# Patient Record
Sex: Female | Born: 1995
Health system: Southern US, Community
[De-identification: ages and names within clinical notes are randomized; demographics above are authoritative.]

## PROBLEM LIST (undated history)

## (undated) DIAGNOSIS — L219 Seborrheic dermatitis, unspecified: Secondary | ICD-10-CM

## (undated) DIAGNOSIS — N390 Urinary tract infection, site not specified: Secondary | ICD-10-CM

## (undated) DIAGNOSIS — G43909 Migraine, unspecified, not intractable, without status migrainosus: Secondary | ICD-10-CM

## (undated) HISTORY — DX: Seborrheic dermatitis, unspecified: L21.9

## (undated) HISTORY — DX: Migraine, unspecified, not intractable, without status migrainosus: G43.909

## (undated) HISTORY — DX: Urinary tract infection, site not specified: N39.0

---
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
## 2017-04-07 ENCOUNTER — Other Ambulatory Visit: Payer: Self-pay

## 2017-04-21 ENCOUNTER — Encounter: Payer: Self-pay | Admitting: Urology

## 2017-04-21 ENCOUNTER — Ambulatory Visit: Payer: BLUE CROSS/BLUE SHIELD | Admitting: Urology

## 2017-04-21 VITALS — BP 115/78 | HR 81 | Resp 16 | Wt 118.0 lb

## 2017-04-21 DIAGNOSIS — N390 Urinary tract infection, site not specified: Secondary | ICD-10-CM | POA: Diagnosis not present

## 2017-04-21 LAB — BLADDER SCAN AMB NON-IMAGING: SCAN RESULT: 21

## 2017-04-21 MED ORDER — CEPHALEXIN 250 MG PO CAPS: ORAL_CAPSULE | ORAL | 0 refills | Status: DC

## 2017-04-21 NOTE — Progress Notes (Signed)
04/21/2017 4:49 PM   [place] 03/25/1995 [id number]  Referring provider: Karen Kitchens, MD [address] [place], [place] [address]  Chief Complaint  Patient presents with  . Urinary Tract Infection    HPI: Patient is a 22 -year-old Caucasian female who is referred to [place] by, Dr. Corinda Gubler, for recurrent urinary tract infections.  Patient states that she has had 2 to 3 urinary tract infections over the last year.  Reviewing her records,  she has had three positive urine cultures for pan sensitive E. Coli.   Her symptoms with a urinary tract infection consist of frequency, dysuria and nocturia.  She did have a fever of 102 with chills associated with her last UTI.     She does not have a history of nephrolithiasis, GU surgery or GU trauma.  She is sexually active.  She has noted a correlation with her urinary tract infections and sexual intercourse.   She does not engage in anal sex.   She is voiding before and after sex.     She is not postmenopausal.   She denies constipation and/or diarrhea.  She does use tampons.  She does engage in good perineal hygiene. She does not take tub baths.     She does not have incontinence.   She is not having pain with bladder filling.    She has not had a RUS in 2016 which was normal.    She is drinking "quite a bit" of water daily.  She does not drink sodas or sugary drinks.     Her UA is negative.  Her PVR is 21 mL.     PMH: Past Medical History:  Diagnosis Date  . Migraine     Surgical History: History reviewed. No pertinent surgical history.  Home Medications:  Allergies as of 04/21/2017      Reactions   Sulfa Antibiotics Rash   Sulfate Rash      Medication List        Accurate as of 04/21/17 11:59 PM. Always use your most recent med list.          cephALEXin 250 MG capsule Commonly known as:  KEFLEX Take one capsule daily   SPRINTEC 28 0.25-35 MG-MCG tablet Generic drug:  norgestimate-ethinyl  estradiol TAKE 1 TABLET BY MOUTH EVERY DAY CONTINUOUSLY       Allergies:  Allergies  Allergen Reactions  . Sulfa Antibiotics Rash  . Sulfate Rash    Family History: Family History  Problem Relation Age of Onset  . Lung cancer Maternal Grandfather   . AVM Maternal Aunt     Social History:  reports that  has never smoked. she has never used smokeless tobacco. She reports that she uses drugs. Drug: Marijuana. She reports that she does not drink alcohol.  ROS: UROLOGY Frequent Urination?: Yes Hard to postpone urination?: No Burning/pain with urination?: Yes Get up at night to urinate?: Yes Leakage of urine?: No Urine stream starts and stops?: No Trouble starting stream?: No Do you have to strain to urinate?: No Blood in urine?: No Urinary tract infection?: Yes Sexually transmitted disease?: No Injury to kidneys or bladder?: No Painful intercourse?: No Weak stream?: No Currently pregnant?: No Vaginal bleeding?: No Last menstrual period?: 03/21/17  Gastrointestinal Nausea?: No Vomiting?: No Indigestion/heartburn?: No Diarrhea?: No Constipation?: No  Constitutional Fever: Yes Night sweats?: No Weight loss?: No Fatigue?: No  Skin Skin rash/lesions?: No Itching?: No  Eyes Blurred vision?: No Double vision?: No  Ears/Nose/Throat  Sore throat?: No Sinus problems?: No  Hematologic/Lymphatic Swollen glands?: No Easy bruising?: No  Cardiovascular Leg swelling?: No Chest pain?: No  Respiratory Cough?: No Shortness of breath?: No  Endocrine Excessive thirst?: No  Musculoskeletal Back pain?: Yes Joint pain?: No  Neurological Headaches?: No Dizziness?: No  Psychologic Depression?: No Anxiety?: No  Physical Exam: BP 115/78   Pulse 81   Resp 16   Wt 118 lb (53.5 kg)   SpO2 97%   Constitutional: Well nourished. Alert and oriented, No acute distress. HEENT: [place] AT, moist mucus membranes. Trachea midline, no masses. Cardiovascular: No  clubbing, cyanosis, or edema. Respiratory: Normal respiratory effort, no increased work of breathing. GI: Abdomen is soft, non tender, non distended, no abdominal masses. Liver and spleen not palpable.  No hernias appreciated.  Stool sample for occult testing is not indicated.   GU: No CVA tenderness.  No bladder fullness or masses.  Normal external genitalia, normal pubic hair distribution, no lesions.  Normal urethral meatus, no lesions, no prolapse, no discharge.   No urethral masses, tenderness and/or tenderness. No bladder fullness, tenderness or masses. Normal vagina mucosa, good estrogen effect, no discharge, no lesions, good pelvic support, no cystocele or rectocele noted.  No cervical motion tenderness.  Uterus is freely mobile and non-fixed.  No adnexal/parametria masses or tenderness noted.  Anus and perineum are without rashes or lesions.    Skin: No rashes, bruises or suspicious lesions. Lymph: No cervical or inguinal adenopathy. Neurologic: Grossly intact, no focal deficits, moving all 4 extremities. Psychiatric: Normal mood and affect.  Laboratory Data: No results found for: WBC, HGB, HCT, MCV, PLT  No results found for: CREATININE  No results found for: PSA  No results found for: TESTOSTERONE  No results found for: HGBA1C  No results found for: TSH  No results found for: CHOL, HDL, CHOLHDL, VLDL, LDLCALC  No results found for: AST No results found for: ALT No components found for: ALKALINEPHOPHATASE No components found for: BILIRUBINTOTAL  No results found for: ESTRADIOL  Urinalysis Negative.  See Epic.  I have reviewed the labs.   Pertinent Imaging: Results for SETAREH, ROM ROSE (MRN [id number]) as of 04/22/2017 16:37  Ref. Range 04/21/2017 16:18  Scan Result Unknown 21     Assessment & Plan:    1. Recurrent UTI's  - criteria for recurrent UTI has been met with 2 or more infections in 6 months or 3 or greater infections in one year   - Patient is  instructed to increase their water intake until the urine is pale yellow or clear (10 to 12 cups daily)   - Patient is encouraged to take probiotics (yogurt, oral pills or vaginal suppositories), take cranberry pills or drink the juice and Vitamin C 1,000 mg daily to acidify the urine daily   - if using tampons, she should remove them prior to urinating and change them often   - avoid soaking in tubs and wipe front to back after urinating   - advised them to have CATH UA's for urinalysis and culture to prevent skin contamination of the specimen  - reviewed symptoms of UTI and advised not to have urine checked or be treated for UTI if not experiencing symptoms  - discussed antibiotic stewardship with the patient                                      - Urinalysis,  Complete  - Bladder Scan (Post Void Residual) in office  - as patient is having sex on an almost daily basis - will prescribe Keflex 250 mg daily as a post-coital antibiotic for one month  - RTC in month for symptom recheck or sooner if she should experience breakthrough infections on the antibiotic  - schedule RUS to evaluate for a possible nidus for the infections   Return in about 1 month (around 05/19/2017) for recheck .  These notes generated with voice recognition software. I apologize for typographical errors.  Zara Council, [facility] [address] [place], [place] [address] [phone]

## 2017-04-21 NOTE — Patient Instructions (Signed)

## 2017-04-22 LAB — URINALYSIS, COMPLETE
Bilirubin, UA: NEGATIVE
GLUCOSE, UA: NEGATIVE
Ketones, UA: NEGATIVE
Nitrite, UA: NEGATIVE
Specific Gravity, UA: 1.02 (ref 1.005–1.030)
Urobilinogen, Ur: 0.2 mg/dL (ref 0.2–1.0)
pH, UA: 7 (ref 5.0–7.5)

## 2017-04-22 LAB — MICROSCOPIC EXAMINATION

## 2017-05-13 DIAGNOSIS — B373 Candidiasis of vulva and vagina: Secondary | ICD-10-CM | POA: Diagnosis not present

## 2017-05-17 ENCOUNTER — Ambulatory Visit
Admission: RE | Admit: 2017-05-17 | Discharge: 2017-05-17 | Disposition: A | Discharge status: Home / Self Care | Payer: BLUE CROSS/BLUE SHIELD | Source: Ambulatory Visit | Attending: Urology | Admitting: Urology

## 2017-05-17 DIAGNOSIS — N390 Urinary tract infection, site not specified: Secondary | ICD-10-CM | POA: Diagnosis not present

## 2017-05-19 ENCOUNTER — Ambulatory Visit: Payer: BLUE CROSS/BLUE SHIELD | Admitting: Urology

## 2017-05-25 NOTE — Progress Notes (Signed)
05/26/2017 3:05 PM   Denise PolesHanna Rose Butler [birth year] [id number]  Referring provider: Gilles Chiquitoabinowitz, Joseph H, MD [address] [place], [place] [id number]  No chief complaint on file.   HPI: Patient is a 22 year old Caucasian female with a history of recurrent urinary tract infections who was placed on post coital Keflex and presents today for a one-month follow-up.  Background history Patient is a 22 -year-old Caucasian female who is referred to us by, Dr. Ellin Goodieabinowitz, for recurrent urinary tract infections.  Patient states that she has had 2 to 3 urinary tract infections over the last year.  Reviewing her records,  she has had three positive urine cultures for pan sensitive E. Coli.   Her symptoms with a urinary tract infection consist of frequency, dysuria and nocturia.  She did have a fever of 102 with chills associated with her last UTI.   She does not have a history of nephrolithiasis, GU surgery or GU trauma.  She is sexually active.  She has noted a correlation with her urinary tract infections and sexual intercourse.   She does not engage in anal sex.   She is voiding before and after sex.   She is not postmenopausal.   She denies constipation and/or diarrhea.  She does use tampons.  She does engage in good perineal hygiene. She does not take tub baths.   She does not have incontinence.   She is not having pain with bladder filling.  She has not had a RUS in 2016 which was normal.  She is drinking "quite a bit" of water daily.  She does not drink sodas or sugary drinks.   Her UA is negative.  Her PVR is 21 mL.    RUS on 05/17/2017 noted mild fullness right renal pelvis without calyceal dilation.  Otherwise negative exam.  Today, she has not had any symptoms of an UTI.  She has not had any breakthrough infections.  Patient denies any gross hematuria, dysuria or suprapubic/flank pain.  Patient denies any fevers, chills, nausea or vomiting.      PMH: Past Medical History:  Diagnosis Date  .  Migraine     Surgical History: History reviewed. No pertinent surgical history.  Home Medications:  Allergies as of 05/26/2017      Reactions   Sulfa Antibiotics Rash   Sulfate Rash      Medication List        Accurate as of 05/26/17 11:59 PM. Always use your most recent med list.          cephALEXin 250 MG capsule Commonly known as:  KEFLEX Take one capsule daily   SPRINTEC 28 0.25-35 MG-MCG tablet Generic drug:  norgestimate-ethinyl estradiol TAKE 1 TABLET BY MOUTH EVERY DAY CONTINUOUSLY       Allergies:  Allergies  Allergen Reactions  . Sulfa Antibiotics Rash  . Sulfate Rash    Family History: Family History  Problem Relation Age of Onset  . Lung cancer Maternal Grandfather   . AVM Maternal Aunt     Social History:  reports that she has never smoked. She has never used smokeless tobacco. She reports that she has current or past drug history. Drug: Marijuana. She reports that she does not drink alcohol.  ROS: UROLOGY Frequent Urination?: No Hard to postpone urination?: No Burning/pain with urination?: No Get up at night to urinate?: No Leakage of urine?: No Urine stream starts and stops?: No Trouble starting stream?: No Do you have to strain to urinate?: No Blood  in urine?: No Urinary tract infection?: No Sexually transmitted disease?: No Injury to kidneys or bladder?: No Painful intercourse?: No Weak stream?: No Currently pregnant?: No Vaginal bleeding?: No Last menstrual period?: 05/10/17  Gastrointestinal Nausea?: No Vomiting?: No Indigestion/heartburn?: No Diarrhea?: No Constipation?: No  Constitutional Fever: No Night sweats?: No Weight loss?: No Fatigue?: No  Skin Skin rash/lesions?: No Itching?: No  Eyes Blurred vision?: No Double vision?: No  Ears/Nose/Throat Sore throat?: No Sinus problems?: No  Hematologic/Lymphatic Swollen glands?: No Easy bruising?: No  Cardiovascular Leg swelling?: No Chest pain?:  No  Respiratory Cough?: No Shortness of breath?: No  Endocrine Excessive thirst?: No  Musculoskeletal Back pain?: No Joint pain?: No  Neurological Headaches?: No Dizziness?: No  Psychologic Depression?: No Anxiety?: No  Physical Exam: BP 104/68 (BP Location: Right Arm, Patient Position: Sitting, Cuff Size: Normal)   Pulse 78   Ht 5\' 6"  (1.676 m)   Wt 120 lb 11.2 oz (54.7 kg)   BMI 19.48 kg/m   Constitutional: Well nourished. Alert and oriented, No acute distress. HEENT: [place] AT, moist mucus membranes. Trachea midline, no masses. Cardiovascular: No clubbing, cyanosis, or edema. Respiratory: Normal respiratory effort, no increased work of breathing. Skin: No rashes, bruises or suspicious lesions. Neurologic: Grossly intact, no focal deficits, moving all 4 extremities. Psychiatric: Normal mood and affect.  Laboratory Data: No results found for: WBC, HGB, HCT, MCV, PLT  No results found for: CREATININE  No results found for: PSA  No results found for: TESTOSTERONE  No results found for: HGBA1C  No results found for: TSH  No results found for: CHOL, HDL, CHOLHDL, VLDL, LDLCALC  No results found for: AST No results found for: ALT No components found for: ALKALINEPHOPHATASE No components found for: BILIRUBINTOTAL  No results found for: ESTRADIOL  I have reviewed the labs.   Pertinent Imaging: CLINICAL DATA:  22 year old female with recurrent UTIs. Initial encounter.  EXAM: RENAL / URINARY TRACT ULTRASOUND COMPLETE  COMPARISON:  None.  FINDINGS: Right Kidney:  Length: 11.3 cm. Echogenicity within normal limits. No mass visualized. Mild fullness right renal pelvis without calyceal dilation.  Left Kidney:  Length: 11.2 cm. Echogenicity within normal limits. No mass or hydronephrosis visualized.  Bladder:  Appears normal for degree of bladder distention. Bilateral ureteral jets noted.  IMPRESSION: Mild fullness right renal pelvis  without calyceal dilation. Otherwise negative exam.   Electronically Signed   By: Lacy Duverney M.D.   On: 05/17/2017 18:56 I have independently reviewed the films    Assessment & Plan:    1. Recurrent UTI's Reviewed UTI prevention techniques Asked patient to discontinue Keflex at this time Asked patient to contact her office if she should experience any symptoms of UTI's  2. Mild fullness of right renal pelvis Explained to the patient that this may be an anatomical variant Did recommend to continue to monitor to ensure it does not progress to a worsening hydronephrosis She return in 3 months time with a repeat renal ultrasound  Return in about 3 months (around 08/25/2017) for RUS report .  These notes generated with voice recognition software. I apologize for typographical errors.  Michiel Cowboy, PA-C  [facility] [address] [place], [place] [address] [phone]

## 2017-05-26 ENCOUNTER — Encounter: Payer: Self-pay | Admitting: Urology

## 2017-05-26 ENCOUNTER — Ambulatory Visit (INDEPENDENT_AMBULATORY_CARE_PROVIDER_SITE_OTHER): Payer: BLUE CROSS/BLUE SHIELD | Admitting: Urology

## 2017-05-26 VITALS — BP 104/68 | HR 78 | Ht 66.0 in | Wt 120.7 lb

## 2017-05-26 DIAGNOSIS — N390 Urinary tract infection, site not specified: Secondary | ICD-10-CM

## 2017-05-26 DIAGNOSIS — R9341 Abnormal radiologic findings on diagnostic imaging of renal pelvis, ureter, or bladder: Secondary | ICD-10-CM

## 2017-08-31 DIAGNOSIS — H5213 Myopia, bilateral: Secondary | ICD-10-CM | POA: Diagnosis not present

## 2017-09-08 ENCOUNTER — Ambulatory Visit: Payer: BLUE CROSS/BLUE SHIELD | Admitting: Urology

## 2017-11-16 DIAGNOSIS — Z681 Body mass index (BMI) 19 or less, adult: Secondary | ICD-10-CM | POA: Diagnosis not present

## 2017-11-16 DIAGNOSIS — Z01419 Encounter for gynecological examination (general) (routine) without abnormal findings: Secondary | ICD-10-CM | POA: Diagnosis not present

## 2017-11-16 DIAGNOSIS — Z118 Encounter for screening for other infectious and parasitic diseases: Secondary | ICD-10-CM | POA: Diagnosis not present

## 2017-12-29 DIAGNOSIS — Z114 Encounter for screening for human immunodeficiency virus [HIV]: Secondary | ICD-10-CM | POA: Diagnosis not present

## 2017-12-29 DIAGNOSIS — Z113 Encounter for screening for infections with a predominantly sexual mode of transmission: Secondary | ICD-10-CM | POA: Diagnosis not present

## 2017-12-29 DIAGNOSIS — Z118 Encounter for screening for other infectious and parasitic diseases: Secondary | ICD-10-CM | POA: Diagnosis not present

## 2017-12-29 DIAGNOSIS — Z1159 Encounter for screening for other viral diseases: Secondary | ICD-10-CM | POA: Diagnosis not present

## 2017-12-30 DIAGNOSIS — Z202 Contact with and (suspected) exposure to infections with a predominantly sexual mode of transmission: Secondary | ICD-10-CM | POA: Diagnosis not present

## 2018-08-04 DIAGNOSIS — Z202 Contact with and (suspected) exposure to infections with a predominantly sexual mode of transmission: Secondary | ICD-10-CM | POA: Diagnosis not present

## 2018-08-15 ENCOUNTER — Other Ambulatory Visit: Payer: Self-pay

## 2018-08-15 DIAGNOSIS — Z0189 Encounter for other specified special examinations: Secondary | ICD-10-CM

## 2018-08-15 LAB — POCT URINALYSIS DIPSTICK
Bilirubin, UA: NEGATIVE
Blood, UA: POSITIVE
Glucose, UA: NEGATIVE
Ketones, UA: NEGATIVE
Leukocytes, UA: NEGATIVE
Nitrite, UA: NEGATIVE
Protein, UA: NEGATIVE
Spec Grav, UA: >=1.03 — AB (ref 1.010–1.025)
Urobilinogen, UA: 0.2 E.U./dL
pH, UA: 6 (ref 5.0–8.0)

## 2018-08-16 LAB — CMP12+LP+TP+TSH+6AC+CBC/D/PLT
ALT: 11 IU/L (ref 0–32)
AST: 17 IU/L (ref 0–40)
Albumin/Globulin Ratio: 1.7 (ref 1.2–2.2)
Albumin: 4.7 g/dL (ref 3.9–5.0)
Alkaline Phosphatase: 69 IU/L (ref 39–117)
BUN/Creatinine Ratio: 18 (ref 9–23)
BUN: 14 mg/dL (ref 6–20)
Basophils Absolute: 0.1 10*3/uL (ref 0.0–0.2)
Basos: 1 %
Bilirubin Total: 0.2 mg/dL (ref 0.0–1.2)
Calcium: 9.8 mg/dL (ref 8.7–10.2)
Chloride: 102 mmol/L (ref 96–106)
Chol/HDL Ratio: 2.5 ratio (ref 0.0–4.4)
Cholesterol, Total: 146 mg/dL (ref 100–199)
Creatinine, Ser: 0.8 mg/dL (ref 0.57–1.00)
EOS (ABSOLUTE): 0.3 10*3/uL (ref 0.0–0.4)
Eos: 3 %
Estimated CHD Risk: <0.5 times avg. (ref 0.0–1.0)
Free Thyroxine Index: 2 (ref 1.2–4.9)
GFR calc Af Amer: 123 mL/min/{1.73_m2} (ref >59)
GFR calc non Af Amer: 106 mL/min/{1.73_m2} (ref >59)
GGT: 10 IU/L (ref 0–60)
Globulin, Total: 2.7 g/dL (ref 1.5–4.5)
Glucose: 91 mg/dL (ref 65–99)
HDL: 58 mg/dL (ref >39)
Hematocrit: 41.1 % (ref 34.0–46.6)
Hemoglobin: 13.5 g/dL (ref 11.1–15.9)
Immature Grans (Abs): 0 10*3/uL (ref 0.0–0.1)
Immature Granulocytes: 0 %
Iron: 59 ug/dL (ref 27–159)
LDH: 143 IU/L (ref 119–226)
LDL Calculated: 70 mg/dL (ref 0–99)
Lymphocytes Absolute: 2.6 10*3/uL (ref 0.7–3.1)
Lymphs: 30 %
MCH: 28.9 pg (ref 26.6–33.0)
MCHC: 32.8 g/dL (ref 31.5–35.7)
MCV: 88 fL (ref 79–97)
Monocytes Absolute: 0.4 10*3/uL (ref 0.1–0.9)
Monocytes: 5 %
Neutrophils Absolute: 5.2 10*3/uL (ref 1.4–7.0)
Neutrophils: 61 %
Phosphorus: 4.2 mg/dL (ref 3.0–4.3)
Platelets: 323 10*3/uL (ref 150–450)
Potassium: 4.3 mmol/L (ref 3.5–5.2)
RBC: 4.67 x10E6/uL (ref 3.77–5.28)
RDW: 12.1 % (ref 11.7–15.4)
Sodium: 140 mmol/L (ref 134–144)
T3 Uptake Ratio: 19 % — ABNORMAL LOW (ref 24–39)
T4, Total: 10.3 ug/dL (ref 4.5–12.0)
TSH: 3.54 u[IU]/mL (ref 0.450–4.500)
Total Protein: 7.4 g/dL (ref 6.0–8.5)
Triglycerides: 89 mg/dL (ref 0–149)
Uric Acid: 4.1 mg/dL (ref 2.5–7.1)
VLDL Cholesterol Cal: 18 mg/dL (ref 5–40)
WBC: 8.6 10*3/uL (ref 3.4–10.8)

## 2018-09-06 IMAGING — US US RENAL
1 series · 14 of 25 positions shown · non-contrast
Comparison: None.

CLINICAL DATA: 21-year-old female with recurrent UTIs. Initial
encounter.

EXAM:
RENAL / URINARY TRACT ULTRASOUND COMPLETE

[Series 1: us renal · 0.20mm/px · 14 of 42 slices shown]
[im 1/42]
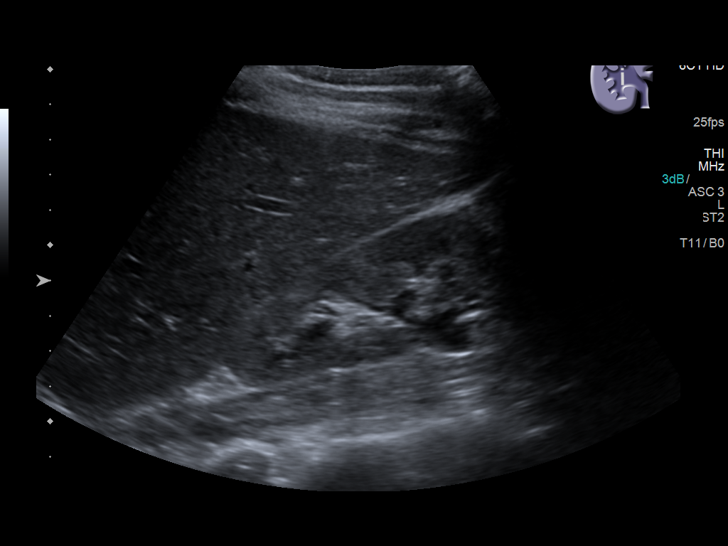
[im 4/42]
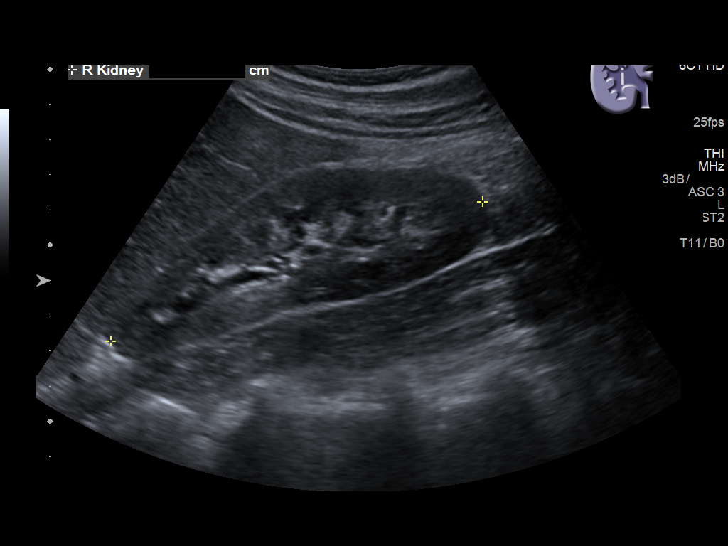
[im 7/42]
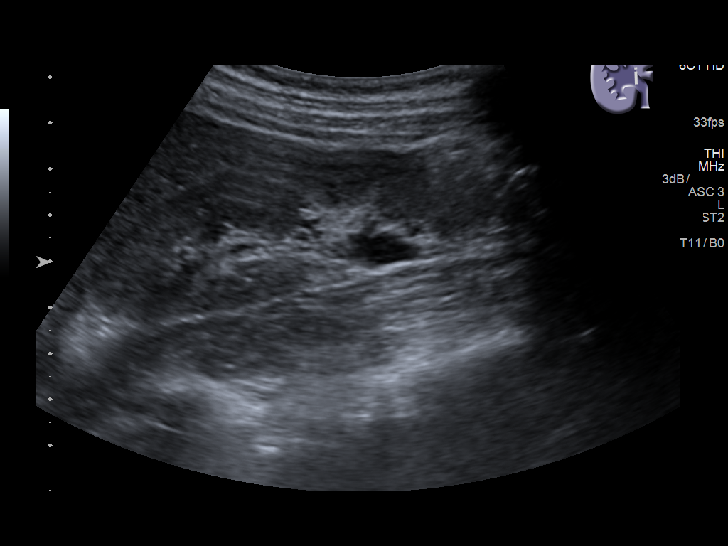
[im 11/42]
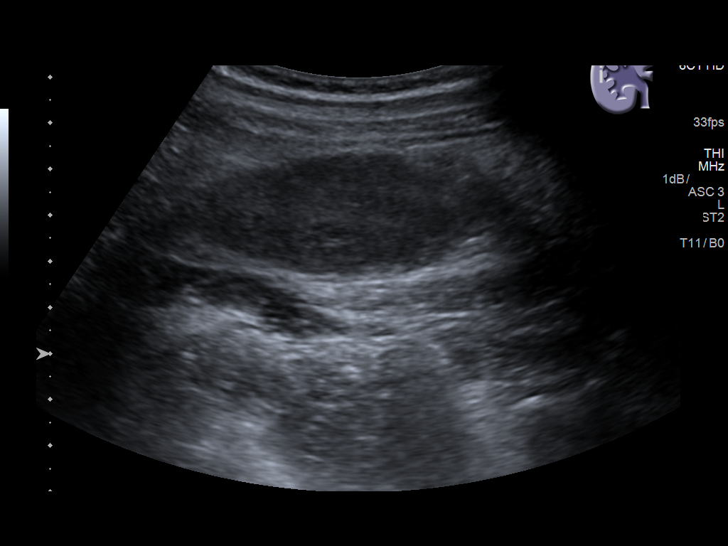
[im 14/42]
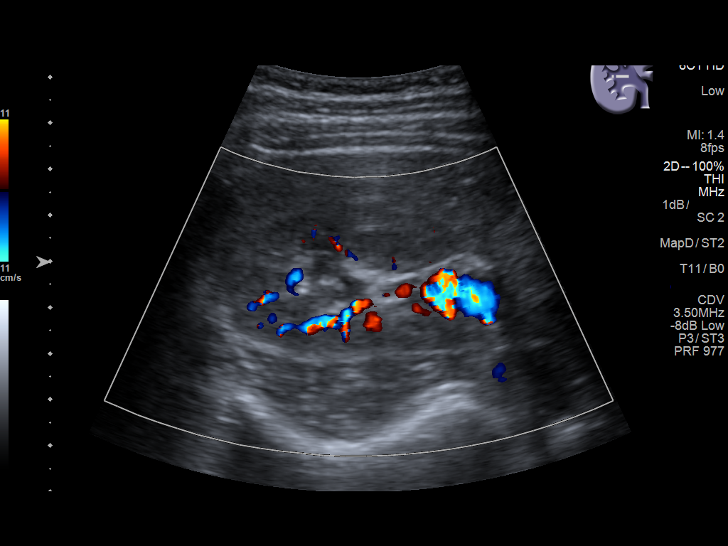
[im 16/42]
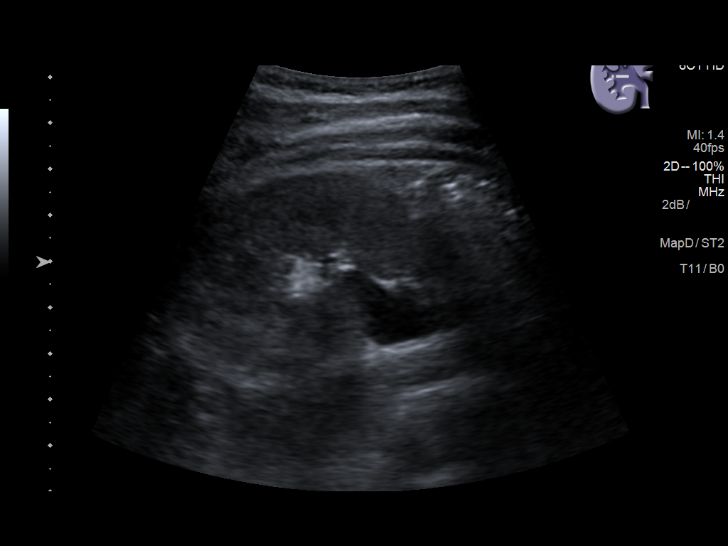
[im 19/42]
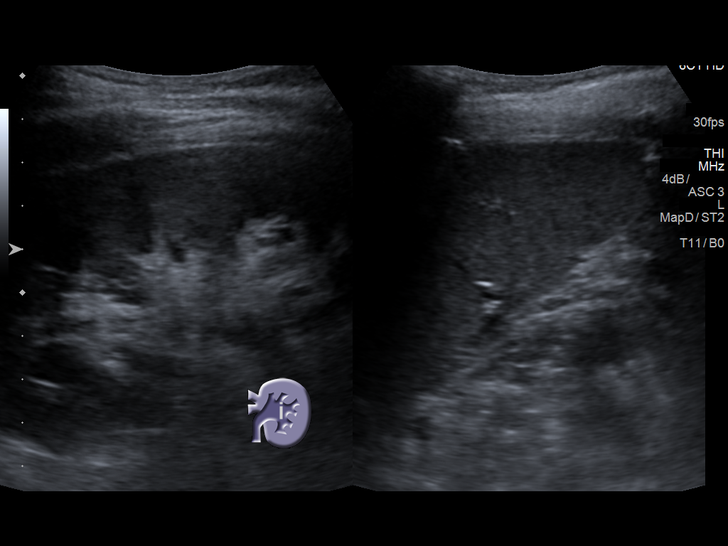
[im 23/42]
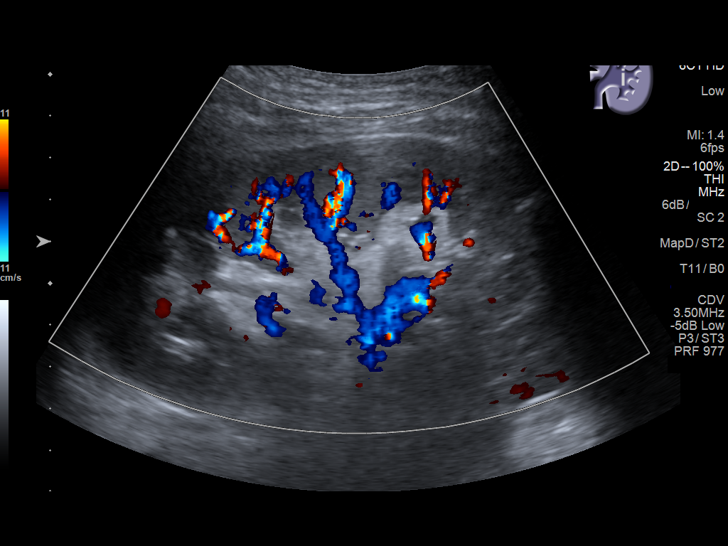
[im 26/42]
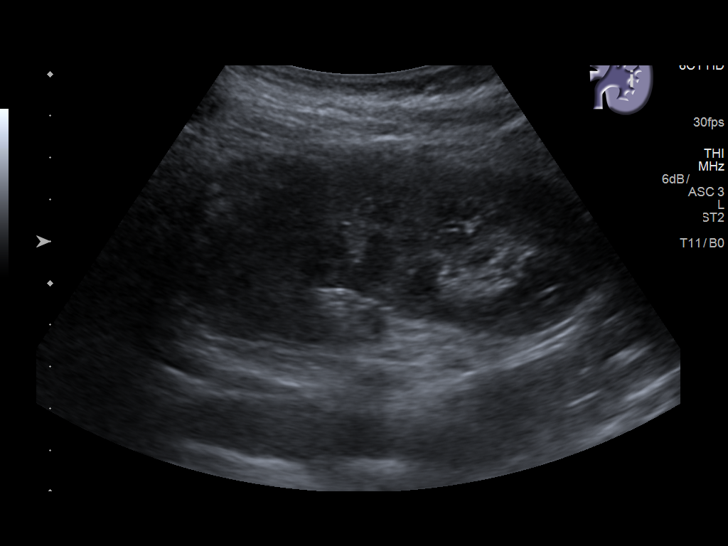
[im 28/42]
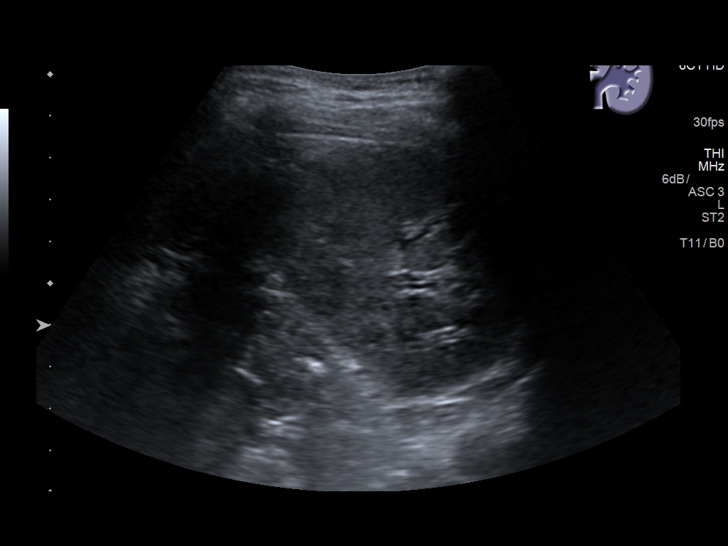
[im 31/42]
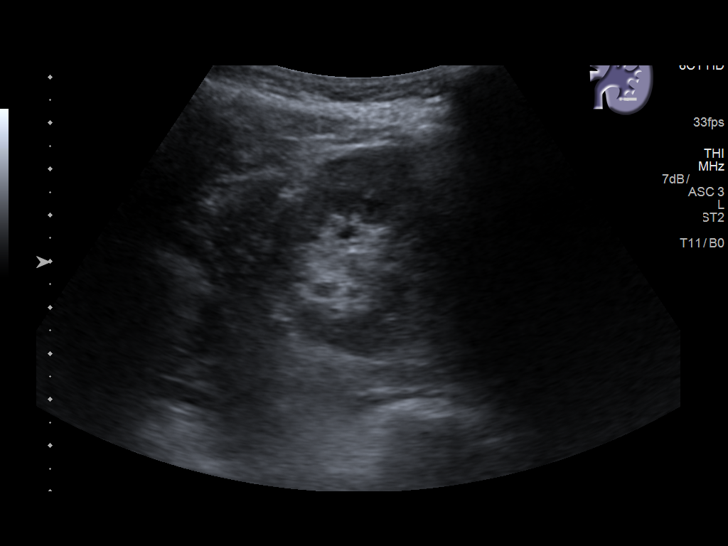
[im 35/42]
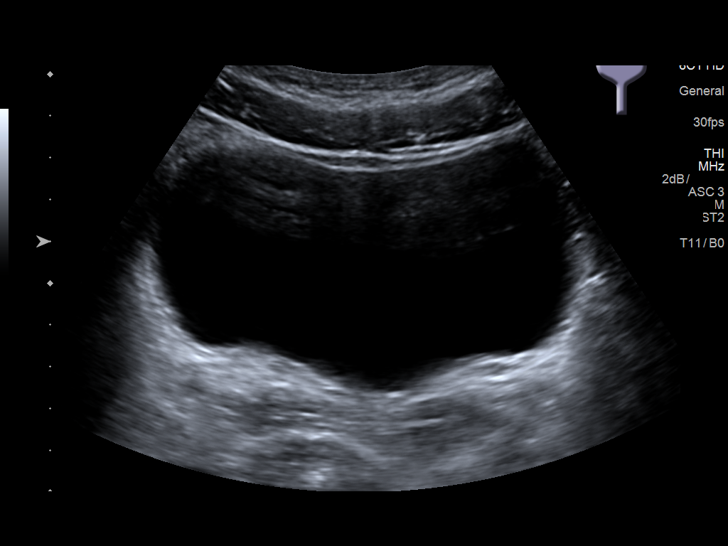
[im 38/42]
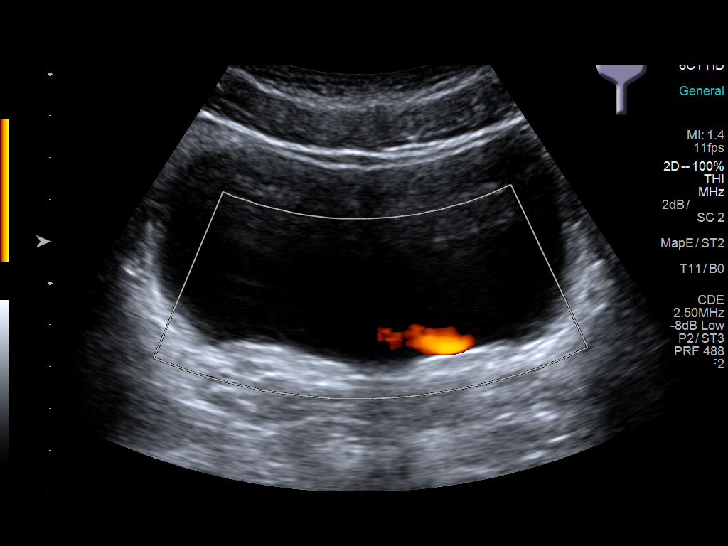
[im 42/42]
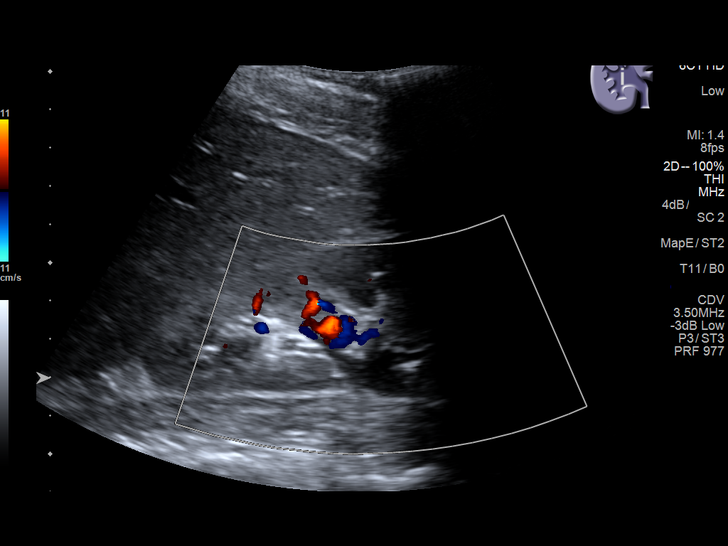

[14 of 25 positions shown; findings below may reference images not displayed]

FINDINGS: Right Kidney:

Length: 11.3 cm.. Echogenicity within normal limits. No mass
visualized. Mild fullness right renal pelvis without calyceal
dilation.

Left Kidney:

Length: 11.2 cm. Echogenicity within normal limits. No mass or
hydronephrosis visualized.

Bladder:

Appears normal for degree of bladder distention. Bilateral ureteral
jets noted.
IMPRESSION: Mild fullness right renal pelvis without calyceal dilation.
Otherwise negative exam.

## 2018-09-15 ENCOUNTER — Ambulatory Visit: Payer: Self-pay | Admitting: Internal Medicine

## 2018-09-15 ENCOUNTER — Other Ambulatory Visit: Payer: Self-pay

## 2018-09-15 ENCOUNTER — Encounter: Payer: Self-pay | Admitting: Internal Medicine

## 2018-09-15 VITALS — BP 131/88 | HR 91 | Temp 98.5°F | Resp 14 | Ht 67.0 in | Wt 117.0 lb

## 2018-09-15 DIAGNOSIS — B373 Candidiasis of vulva and vagina: Secondary | ICD-10-CM

## 2018-09-15 DIAGNOSIS — B3731 Acute candidiasis of vulva and vagina: Secondary | ICD-10-CM

## 2018-09-15 MED ORDER — FLUCONAZOLE 150 MG PO TABS: 150.0000 mg | ORAL_TABLET | Freq: Once | ORAL | 0 refills | Status: AC

## 2018-09-15 NOTE — Progress Notes (Signed)
S -patient is a 23 year old female who presents with a chief complaint of some vaginal itching and soreness.  She has had symptoms like this in the past, and was given a pill which was helpful.  She notes some discharge, more whitish, no discoloration otherwise.  No problems with urination including no burning with urination.  She notes she does not think this is a bladder infection.  No flank pains or fevers.  She notes some mild discomfort at times in the suprapubic region although not marked.   Allergies  Allergen Reactions  . Sulfa Antibiotics Rash  . Sulfate Rash   Current Outpatient Medications on File Prior to Visit  Medication Sig Dispense Refill  . benzocaine-resorcinol (VAGISIL) 5-2 % vaginal cream Place vaginally at bedtime.    . norgestimate-ethinyl estradiol (SPRINTEC 28) 0.25-35 MG-MCG tablet TAKE 1 TABLET BY MOUTH EVERY DAY CONTINUOUSLY     No current facility-administered medications on file prior to visit.    No tob hx  Period due next week.  O - NAD, masked, not ill appearing BP 131/88 (BP Location: Left Arm, Patient Position: Sitting, Cuff Size: Normal)   Pulse 91   Temp 98.5 F (36.9 C) (Oral)   Resp 14   Ht 5\' 7"  (1.702 m)   Wt 117 lb (53.1 kg)   LMP 07/18/2018 (Within Days) Comment: irregular   SpO2 100%   BMI 18.32 kg/m    Affect not flat, approp with conversation  Assessment- 1.  Vaginal itching -prob yeast infection  With the whitish discharge, the itching, and a history of this felt to be a yeast infection, seems the most likely diagnosis as discussed.  She was aware that we do not routinely do gynecological evaluations here in the [facility].  She noted to my nurse earlier that this was just much more convenient for her, and was hoping to get this taken care of here.  Plan 1.  Will add Diflucan-150 mg as 1 dose, take 1 pill today, and assess. Did include a second pill to have in case needed over time.   2.  Emphasized if  her symptoms are not improving and resolving over the next few days, she needs to be seen by her gynecologist, with further testing done to help with further management.  She was very understanding of that recommendation, and the importance of being seen even sooner if she is having more discharge, any discoloration with that, more discomfort, any fevers.

## 2018-09-27 ENCOUNTER — Encounter: Payer: Self-pay | Admitting: Internal Medicine

## 2018-09-29 ENCOUNTER — Encounter: Payer: Self-pay | Admitting: Internal Medicine

## 2019-01-05 DIAGNOSIS — J039 Acute tonsillitis, unspecified: Secondary | ICD-10-CM | POA: Diagnosis not present

## 2019-01-05 DIAGNOSIS — J029 Acute pharyngitis, unspecified: Secondary | ICD-10-CM | POA: Diagnosis not present

## 2019-01-05 DIAGNOSIS — Z03818 Encounter for observation for suspected exposure to other biological agents ruled out: Secondary | ICD-10-CM | POA: Diagnosis not present

## 2019-01-05 DIAGNOSIS — R6889 Other general symptoms and signs: Secondary | ICD-10-CM | POA: Diagnosis not present

## 2019-01-18 ENCOUNTER — Other Ambulatory Visit: Payer: Self-pay

## 2019-01-18 DIAGNOSIS — Z20822 Contact with and (suspected) exposure to covid-19: Secondary | ICD-10-CM

## 2019-01-19 LAB — NOVEL CORONAVIRUS, NAA: SARS-CoV-2, NAA: DETECTED — AB

## 2019-03-21 DIAGNOSIS — Z20828 Contact with and (suspected) exposure to other viral communicable diseases: Secondary | ICD-10-CM | POA: Diagnosis not present

## 2019-04-28 DIAGNOSIS — Z681 Body mass index (BMI) 19 or less, adult: Secondary | ICD-10-CM | POA: Diagnosis not present

## 2019-04-28 DIAGNOSIS — Z118 Encounter for screening for other infectious and parasitic diseases: Secondary | ICD-10-CM | POA: Diagnosis not present

## 2019-04-28 DIAGNOSIS — R69 Illness, unspecified: Secondary | ICD-10-CM | POA: Diagnosis not present

## 2019-04-28 DIAGNOSIS — Z01419 Encounter for gynecological examination (general) (routine) without abnormal findings: Secondary | ICD-10-CM | POA: Diagnosis not present

## 2019-09-25 DIAGNOSIS — G43109 Migraine with aura, not intractable, without status migrainosus: Secondary | ICD-10-CM | POA: Diagnosis not present

## 2019-09-25 DIAGNOSIS — Z7689 Persons encountering health services in other specified circumstances: Secondary | ICD-10-CM | POA: Diagnosis not present

## 2019-09-25 DIAGNOSIS — Z87440 Personal history of urinary (tract) infections: Secondary | ICD-10-CM | POA: Diagnosis not present

## 2019-10-05 DIAGNOSIS — H5213 Myopia, bilateral: Secondary | ICD-10-CM | POA: Diagnosis not present

## 2019-12-01 DIAGNOSIS — N760 Acute vaginitis: Secondary | ICD-10-CM | POA: Diagnosis not present

## 2020-03-01 DIAGNOSIS — Z23 Encounter for immunization: Secondary | ICD-10-CM | POA: Diagnosis not present

## 2021-04-28 ENCOUNTER — Telehealth: Payer: Self-pay | Admitting: Family Medicine

## 2021-05-05 ENCOUNTER — Other Ambulatory Visit: Payer: Self-pay

## 2021-05-05 ENCOUNTER — Ambulatory Visit: Payer: Self-pay | Admitting: Family Medicine

## 2021-05-05 ENCOUNTER — Encounter: Payer: Self-pay | Admitting: Family Medicine

## 2021-05-05 DIAGNOSIS — Z113 Encounter for screening for infections with a predominantly sexual mode of transmission: Secondary | ICD-10-CM

## 2021-05-05 LAB — WET PREP FOR TRICH, YEAST, CLUE
Trichomonas Exam: NEGATIVE
Yeast Exam: NEGATIVE

## 2021-05-05 LAB — HM HIV SCREENING LAB: HM HIV Screening: NEGATIVE

## 2021-05-05 NOTE — Progress Notes (Signed)
Patient here for STD testing. Wet prep reviewed, no tx per standing orders. Condoms declined.  °

## 2021-05-05 NOTE — Progress Notes (Signed)
[facility] Department ? ?STI clinic/screening visit ?[address] ?[place] [address] ?[phone] ? ?Subjective:  ?Denise Butler is a 26 y.o. female being seen today for an STI screening visit. The patient reports they do not have symptoms.  Patient reports that they do not desire a pregnancy in the next year.   They reported they are not interested in discussing contraception today.   ? ?Patient's last menstrual period was 04/27/2021 (approximate). ? ? ?Patient has the following medical conditions:   ?Patient Active Problem List  ? Diagnosis Date Noted  ? Intractable chronic migraine without aura and without status migrainosus 05/07/2015  ? ? ?Chief Complaint  ?Patient presents with  ? SEXUALLY TRANSMITTED DISEASE  ?  Screening  ? ? ?HPI ? ?Patient reports here for screening, denies s/sx  ? ?No previous HIV testing ?No previous pap. .  ? ?Screening for MPX risk: ?Does the patient have an unexplained rash? No ?Is the patient MSM? No ?Does the patient endorse multiple sex partners or anonymous sex partners? No ?Did the patient have close or sexual contact with a person diagnosed with MPX? No ?Has the patient traveled outside the [place] where MPX is endemic? No ?Is there a high clinical suspicion for MPX-- evidenced by one of the following No ? -Unlikely to be chickenpox ? -Lymphadenopathy ? -Rash that present in same phase of evolution on any given body part ?See flowsheet for further details and programmatic requirements.  ? ? ?The following portions of the patient's history were reviewed and updated as appropriate: allergies, current medications, past medical history, past social history, past surgical history and problem list. ? ?Objective:  ?There were no vitals filed for this visit. ? ?Physical Exam ?Vitals and nursing note reviewed.  ?Constitutional:   ?   Appearance: Normal appearance.  ?HENT:  ?   Head: Normocephalic and atraumatic.  ?   Mouth/Throat:  ?   Mouth: Mucous membranes  are moist.  ?   Pharynx: Oropharynx is clear. No oropharyngeal exudate or posterior oropharyngeal erythema.  ?Pulmonary:  ?   Effort: Pulmonary effort is normal.  ?Abdominal:  ?   General: Abdomen is flat.  ?   Palpations: There is no mass.  ?   Tenderness: There is no abdominal tenderness. There is no rebound.  ?Genitourinary: ?   Exam position: Lithotomy position.  ?   Pubic Area: No rash or pubic lice.   ?   Labia:     ?   Right: No rash or lesion.     ?   Left: No rash or lesion.   ?   Vagina: No erythema, bleeding or lesions.  ?   Cervix: No cervical motion tenderness, discharge, friability, lesion or erythema.  ?   Uterus: Normal.   ?   Adnexa: Right adnexa normal and left adnexa normal.  ?   Comments: Deferred  ?Lymphadenopathy:  ?   Head:  ?   Right side of head: No preauricular or posterior auricular adenopathy.  ?   Left side of head: No preauricular or posterior auricular adenopathy.  ?   Cervical: No cervical adenopathy.  ?   Upper Body:  ?   Right upper body: No supraclavicular or axillary adenopathy.  ?   Left upper body: No supraclavicular or axillary adenopathy.  ?   Lower Body: No right inguinal adenopathy. No left inguinal adenopathy.  ?Skin: ?   General: Skin is warm and dry.  ?   Findings:  No rash.  ?Neurological:  ?   Mental Status: She is alert and oriented to person, place, and time.  ? ? ? ?Assessment and Plan:  ?Denise Butler is a 26 y.o. female presenting to the [facility] Department for STI screening ? ?1. Screening examination for venereal disease ?Patient accepted all screenings including wet prep, oral, vaginal CT/GC and bloodwork for HIV/RPR.  ?Patient meets criteria for HepB screening? No. Ordered? No ?Patient meets criteria for HepC screening? No. Ordered? No ? ?Wet prep results neg    ?Treatment needed  ?Discussed time line for [facility] results and that patient will be called with positive results and encouraged patient to call if she had not heard in 2 weeks.   ?Counseled to return or seek care for continued or worsening symptoms ?Recommended condom use with all sex ? ?Patient is currently using  OCP  to prevent pregnancy.   ? ? ?Anticipated Guidance - need for pap ? ? ?- Chlamydia/Gonorrhea [facility] ?- WET PREP FOR TRICH, YEAST, CLUE ?- HIV [facility] ?- Syphilis Serology, [facility] Lab ? ? ? ? ?Return for as needed. ? ?No future appointments. ? ?Wendi Snipes, FNP ?

## 2021-05-08 DIAGNOSIS — H5213 Myopia, bilateral: Secondary | ICD-10-CM | POA: Diagnosis not present

## 2021-07-08 DIAGNOSIS — Z6820 Body mass index (BMI) 20.0-20.9, adult: Secondary | ICD-10-CM | POA: Diagnosis not present

## 2021-07-08 DIAGNOSIS — Z118 Encounter for screening for other infectious and parasitic diseases: Secondary | ICD-10-CM | POA: Diagnosis not present

## 2021-07-08 DIAGNOSIS — Z01419 Encounter for gynecological examination (general) (routine) without abnormal findings: Secondary | ICD-10-CM | POA: Diagnosis not present

## 2022-01-19 DIAGNOSIS — Z3009 Encounter for other general counseling and advice on contraception: Secondary | ICD-10-CM | POA: Diagnosis not present

## 2022-01-19 DIAGNOSIS — Z3041 Encounter for surveillance of contraceptive pills: Secondary | ICD-10-CM | POA: Diagnosis not present

## 2022-01-19 DIAGNOSIS — G43109 Migraine with aura, not intractable, without status migrainosus: Secondary | ICD-10-CM | POA: Diagnosis not present

## 2022-02-27 DIAGNOSIS — Z3009 Encounter for other general counseling and advice on contraception: Secondary | ICD-10-CM | POA: Diagnosis not present

## 2022-05-19 DIAGNOSIS — H5213 Myopia, bilateral: Secondary | ICD-10-CM | POA: Diagnosis not present

## 2022-05-30 DIAGNOSIS — J069 Acute upper respiratory infection, unspecified: Secondary | ICD-10-CM | POA: Diagnosis not present

## 2022-05-30 DIAGNOSIS — H6692 Otitis media, unspecified, left ear: Secondary | ICD-10-CM | POA: Diagnosis not present

## 2022-06-02 DIAGNOSIS — H73011 Bullous myringitis, right ear: Secondary | ICD-10-CM | POA: Diagnosis not present

## 2022-06-02 DIAGNOSIS — Z20822 Contact with and (suspected) exposure to covid-19: Secondary | ICD-10-CM | POA: Diagnosis not present

## 2022-06-05 DIAGNOSIS — H9041 Sensorineural hearing loss, unilateral, right ear, with unrestricted hearing on the contralateral side: Secondary | ICD-10-CM | POA: Diagnosis not present

## 2022-06-05 DIAGNOSIS — H6591 Unspecified nonsuppurative otitis media, right ear: Secondary | ICD-10-CM | POA: Diagnosis not present

## 2022-06-05 DIAGNOSIS — H9313 Tinnitus, bilateral: Secondary | ICD-10-CM | POA: Diagnosis not present

## 2022-06-05 DIAGNOSIS — H93293 Other abnormal auditory perceptions, bilateral: Secondary | ICD-10-CM | POA: Diagnosis not present

## 2022-06-12 ENCOUNTER — Encounter (HOSPITAL_BASED_OUTPATIENT_CLINIC_OR_DEPARTMENT_OTHER): Payer: Self-pay | Admitting: Nurse Practitioner

## 2022-06-15 ENCOUNTER — Other Ambulatory Visit: Payer: Self-pay | Admitting: Otolaryngology

## 2022-06-15 DIAGNOSIS — H9313 Tinnitus, bilateral: Secondary | ICD-10-CM

## 2022-06-15 DIAGNOSIS — H6591 Unspecified nonsuppurative otitis media, right ear: Secondary | ICD-10-CM

## 2022-06-15 DIAGNOSIS — H93293 Other abnormal auditory perceptions, bilateral: Secondary | ICD-10-CM

## 2022-06-15 DIAGNOSIS — H9041 Sensorineural hearing loss, unilateral, right ear, with unrestricted hearing on the contralateral side: Secondary | ICD-10-CM

## 2022-07-17 ENCOUNTER — Ambulatory Visit
Admission: RE | Admit: 2022-07-17 | Discharge: 2022-07-17 | Disposition: A | Discharge status: Home / Self Care | Payer: 59 | Source: Ambulatory Visit | Attending: Otolaryngology | Admitting: Otolaryngology

## 2022-07-17 DIAGNOSIS — H9313 Tinnitus, bilateral: Secondary | ICD-10-CM | POA: Insufficient documentation

## 2022-07-17 DIAGNOSIS — H93293 Other abnormal auditory perceptions, bilateral: Secondary | ICD-10-CM | POA: Diagnosis not present

## 2022-07-17 DIAGNOSIS — H6591 Unspecified nonsuppurative otitis media, right ear: Secondary | ICD-10-CM | POA: Diagnosis not present

## 2022-07-17 DIAGNOSIS — H9041 Sensorineural hearing loss, unilateral, right ear, with unrestricted hearing on the contralateral side: Secondary | ICD-10-CM | POA: Insufficient documentation

## 2022-07-17 DIAGNOSIS — G3189 Other specified degenerative diseases of nervous system: Secondary | ICD-10-CM | POA: Diagnosis not present

## 2022-07-17 MED ORDER — GADOBUTROL 1 MMOL/ML IV SOLN
5.0000 mL | Freq: Once | INTRAVENOUS | Status: AC | PRN
  Administered 2022-07-17: 5 mL via INTRAVENOUS

## 2022-07-23 DIAGNOSIS — R5383 Other fatigue: Secondary | ICD-10-CM | POA: Diagnosis not present

## 2022-07-23 DIAGNOSIS — Z1331 Encounter for screening for depression: Secondary | ICD-10-CM | POA: Diagnosis not present

## 2022-07-23 DIAGNOSIS — Z0000 Encounter for general adult medical examination without abnormal findings: Secondary | ICD-10-CM | POA: Diagnosis not present

## 2022-07-23 DIAGNOSIS — J029 Acute pharyngitis, unspecified: Secondary | ICD-10-CM | POA: Diagnosis not present

## 2022-07-23 DIAGNOSIS — G43109 Migraine with aura, not intractable, without status migrainosus: Secondary | ICD-10-CM | POA: Diagnosis not present

## 2022-07-23 DIAGNOSIS — Z130 Encounter for screening for diseases of the blood and blood-forming organs and certain disorders involving the immune mechanism: Secondary | ICD-10-CM | POA: Diagnosis not present

## 2022-07-28 DIAGNOSIS — H9313 Tinnitus, bilateral: Secondary | ICD-10-CM | POA: Diagnosis not present

## 2022-07-28 DIAGNOSIS — H9041 Sensorineural hearing loss, unilateral, right ear, with unrestricted hearing on the contralateral side: Secondary | ICD-10-CM | POA: Diagnosis not present

## 2022-07-28 DIAGNOSIS — H9311 Tinnitus, right ear: Secondary | ICD-10-CM | POA: Diagnosis not present

## 2022-08-18 DIAGNOSIS — B001 Herpesviral vesicular dermatitis: Secondary | ICD-10-CM | POA: Diagnosis not present

## 2022-08-18 DIAGNOSIS — K130 Diseases of lips: Secondary | ICD-10-CM | POA: Diagnosis not present

## 2023-02-18 DIAGNOSIS — Z124 Encounter for screening for malignant neoplasm of cervix: Secondary | ICD-10-CM | POA: Diagnosis not present

## 2023-02-18 DIAGNOSIS — Z1331 Encounter for screening for depression: Secondary | ICD-10-CM | POA: Diagnosis not present

## 2023-02-18 DIAGNOSIS — Z113 Encounter for screening for infections with a predominantly sexual mode of transmission: Secondary | ICD-10-CM | POA: Diagnosis not present

## 2023-02-18 DIAGNOSIS — Z01419 Encounter for gynecological examination (general) (routine) without abnormal findings: Secondary | ICD-10-CM | POA: Diagnosis not present

## 2023-02-18 DIAGNOSIS — Z118 Encounter for screening for other infectious and parasitic diseases: Secondary | ICD-10-CM | POA: Diagnosis not present

## 2023-02-18 DIAGNOSIS — Z01411 Encounter for gynecological examination (general) (routine) with abnormal findings: Secondary | ICD-10-CM | POA: Diagnosis not present

## 2023-07-20 DIAGNOSIS — H5213 Myopia, bilateral: Secondary | ICD-10-CM | POA: Diagnosis not present
